# Patient Record
Sex: Male | Born: 1985 | Race: Black or African American | Hispanic: No | Marital: Single
Health system: Southern US, Community
[De-identification: ages and names within clinical notes are randomized; demographics above are authoritative.]

---

## 2017-09-07 ENCOUNTER — Emergency Department
Admission: EM | Admit: 2017-09-07 | Discharge: 2017-09-07 | Disposition: A | Discharge status: Home / Self Care | Payer: Medicaid Other | Attending: Emergency Medicine | Admitting: Emergency Medicine

## 2017-09-07 ENCOUNTER — Emergency Department: Payer: Medicaid Other

## 2017-09-07 ENCOUNTER — Encounter: Payer: Self-pay | Admitting: Emergency Medicine

## 2017-09-07 DIAGNOSIS — S61201A Unspecified open wound of left index finger without damage to nail, initial encounter: Secondary | ICD-10-CM | POA: Insufficient documentation

## 2017-09-07 DIAGNOSIS — Y9289 Other specified places as the place of occurrence of the external cause: Secondary | ICD-10-CM | POA: Insufficient documentation

## 2017-09-07 DIAGNOSIS — Y9389 Activity, other specified: Secondary | ICD-10-CM | POA: Diagnosis not present

## 2017-09-07 DIAGNOSIS — W260XXA Contact with knife, initial encounter: Secondary | ICD-10-CM | POA: Diagnosis not present

## 2017-09-07 DIAGNOSIS — Y999 Unspecified external cause status: Secondary | ICD-10-CM | POA: Insufficient documentation

## 2017-09-07 DIAGNOSIS — S61209A Unspecified open wound of unspecified finger without damage to nail, initial encounter: Secondary | ICD-10-CM

## 2017-09-07 DIAGNOSIS — S6992XA Unspecified injury of left wrist, hand and finger(s), initial encounter: Secondary | ICD-10-CM | POA: Diagnosis present

## 2017-09-07 MED ORDER — NEOMYCIN-POLYMYXIN-PRAMOXINE 1 % EX CREA: TOPICAL_CREAM | Freq: Two times a day (BID) | CUTANEOUS | 0 refills | Status: AC

## 2017-09-07 MED ORDER — LIDOCAINE HCL (PF) 1 % IJ SOLN
INTRAMUSCULAR | Status: AC
  Filled 2017-09-07: qty 5

## 2017-09-07 MED ORDER — TRAMADOL HCL 50 MG PO TABS: 50.0000 mg | ORAL_TABLET | Freq: Two times a day (BID) | ORAL | 0 refills | Status: AC | PRN

## 2017-09-07 NOTE — ED Triage Notes (Signed)
Pt reports was cutting bushes with a knife and cut his index finger on his left hand. Area continues to bleed. Dressing applied.

## 2017-09-07 NOTE — ED Notes (Signed)
First Nurse Note: Per EMS pt was cutting tree limbs with large knife about 2 hours ago, states he "nicked" his index finger on left hand, "chunk" of skin gone exposing bone per EMS. Finger wrapped and not visualized at this time, bleeding noted to cloth around finger.

## 2017-09-07 NOTE — ED Provider Notes (Signed)
[facility] Emergency Department Provider Note   ____________________________________________   First MD Initiated Contact with Patient 09/07/17 1427     (approximate)  I have reviewed the triage vital signs and the nursing notes.   HISTORY  Chief Complaint Laceration and Finger Injury    HPI James Stewart is a 32 y.o. male patient complain laceration involving avulsion of skin from the left index finger prior to arrival.  Patient state he was cutting brush with a knife when he slipped and cut his finger.  Bleeding is only controlled with direct pressure.  Patient denies loss of sensation or loss of function of the finger.  Patient is right-hand dominant.  Patient tetanus shot is up-to-date.  Patient rates his pain as a 9/10.  Patient described the pain is "achy".  Pressure dressing applied in triage.  History reviewed. No pertinent past medical history.  There are no active problems to display for this patient.   History reviewed. No pertinent surgical history.  Prior to Admission medications   Medication Sig Start Date End Date Taking? Authorizing Provider  neomycin-polymyxin-pramoxine (NEOSPORIN PLUS) 1 % cream Apply topically 2 (two) times daily. 09/07/17   Joni Reining, PA-C  traMADol (ULTRAM) 50 MG tablet Take 1 tablet (50 mg total) by mouth every 12 (twelve) hours as needed. 09/07/17   Joni Reining, PA-C    Allergies Haldol [haloperidol]  No family history on file.  Social History Social History   Tobacco Use  . Smoking status: Not on file  Substance Use Topics  . Alcohol use: Not on file  . Drug use: Not on file    Review of Systems Constitutional: No fever/chills Eyes: No visual changes. ENT: No sore throat. Cardiovascular: Denies chest pain. Respiratory: Denies shortness of breath. Gastrointestinal: No abdominal pain.  No nausea, no vomiting.  No diarrhea.  No constipation. Genitourinary: Negative for  dysuria. Musculoskeletal: Negative for back pain. Skin: Negative for rash.  Avulsion injury to the volar aspect of the second digit left hand. Neurological: Negative for headaches, focal weakness or numbness.   ____________________________________________   PHYSICAL EXAM:  VITAL SIGNS: ED Triage Vitals  Enc Vitals Group     BP 09/07/17 1346 (!) 144/101     Pulse Rate 09/07/17 1346 73     Resp 09/07/17 1346 20     Temp 09/07/17 1346 98.4 F (36.9 C)     Temp Source 09/07/17 1346 Oral     SpO2 09/07/17 1346 100 %     Weight 09/07/17 1347 185 lb (83.9 kg)     Height 09/07/17 1347  (1.727 m)     Head Circumference --      Peak Flow --      Pain Score 09/07/17 1346 9     Pain Loc --      Pain Edu? --      Excl. in GC? --    Constitutional: Alert and oriented.  Anxious.   Cardiovascular: Normal rate, regular rhythm. Grossly normal heart sounds.  Good peripheral circulation. Respiratory: Normal respiratory effort.  No retractions. Lungs CTAB. Neurologic:  Normal speech and language. No gross focal neurologic deficits are appreciated.  Skin:  Skin is warm, dry and intact. No rash noted. Psychiatric: Mood and affect are normal. Speech and behavior are normal.  ____________________________________________   LABS (all labs ordered are listed, but only abnormal results are displayed)  Labs Reviewed - No data to display ____________________________________________  EKG   ____________________________________________  RADIOLOGY  No acute findings x-ray of the second digit left hand. Official radiology report(s): Dg Hand Complete Left  Result Date: 09/07/2017 CLINICAL DATA:  32 year old male with a history of hand injury EXAM: LEFT HAND - COMPLETE 3+ VIEW COMPARISON:  None. FINDINGS: No acute displaced fracture. Surgical gauze at the distal aspect of the first finger, left hand, somewhat obscuring the soft tissue details and bony details. No subluxation/dislocation. No  radiopaque foreign body identified. No significant degenerative changes. IMPRESSION: No acute bony abnormality. Electronically Signed   By: Gilmer Mor D.O.   On: 09/07/2017 14:28    ____________________________________________   PROCEDURES  Procedure(s) performed: None  Procedures  Critical Care performed: No  ____________________________________________   INITIAL IMPRESSION / ASSESSMENT AND PLAN / ED COURSE  As part of my medical decision making, I reviewed the following data within the electronic MEDICAL RECORD NUMBER    Avulsion injury to the second digit left hand.  Area was cleaned and Surgicel applied with pressure dressing.  Patient given discharge care instruction.  Patient given prescription for tramadol and Neosporin.  Patient advised to follow-up with the ED department if no improvement in 2 to 3 days.      ____________________________________________   FINAL CLINICAL IMPRESSION(S) / ED DIAGNOSES  Final diagnoses:  Avulsion of skin of finger without complication, initial encounter     ED Discharge Orders        Ordered    traMADol (ULTRAM) 50 MG tablet  Every 12 hours PRN     09/07/17 1432    neomycin-polymyxin-pramoxine (NEOSPORIN PLUS) 1 % cream  2 times daily     09/07/17 1432       Note:  This document was prepared using Dragon voice recognition software and may include unintentional dictation errors.    Joni Reining, PA-C 09/07/17 1438    Minna Antis, MD 09/07/17 [phone]

## 2017-09-07 NOTE — Discharge Instructions (Addendum)
Follow discharge care instructions. 

## 2019-03-18 IMAGING — DX DG HAND COMPLETE 3+V*L*
3 series · 3 of 3 positions shown · non-contrast
Comparison: None.

CLINICAL DATA: 32-year-old male with a history of hand injury

EXAM:
LEFT HAND - COMPLETE 3+ VIEW

[hand ap]
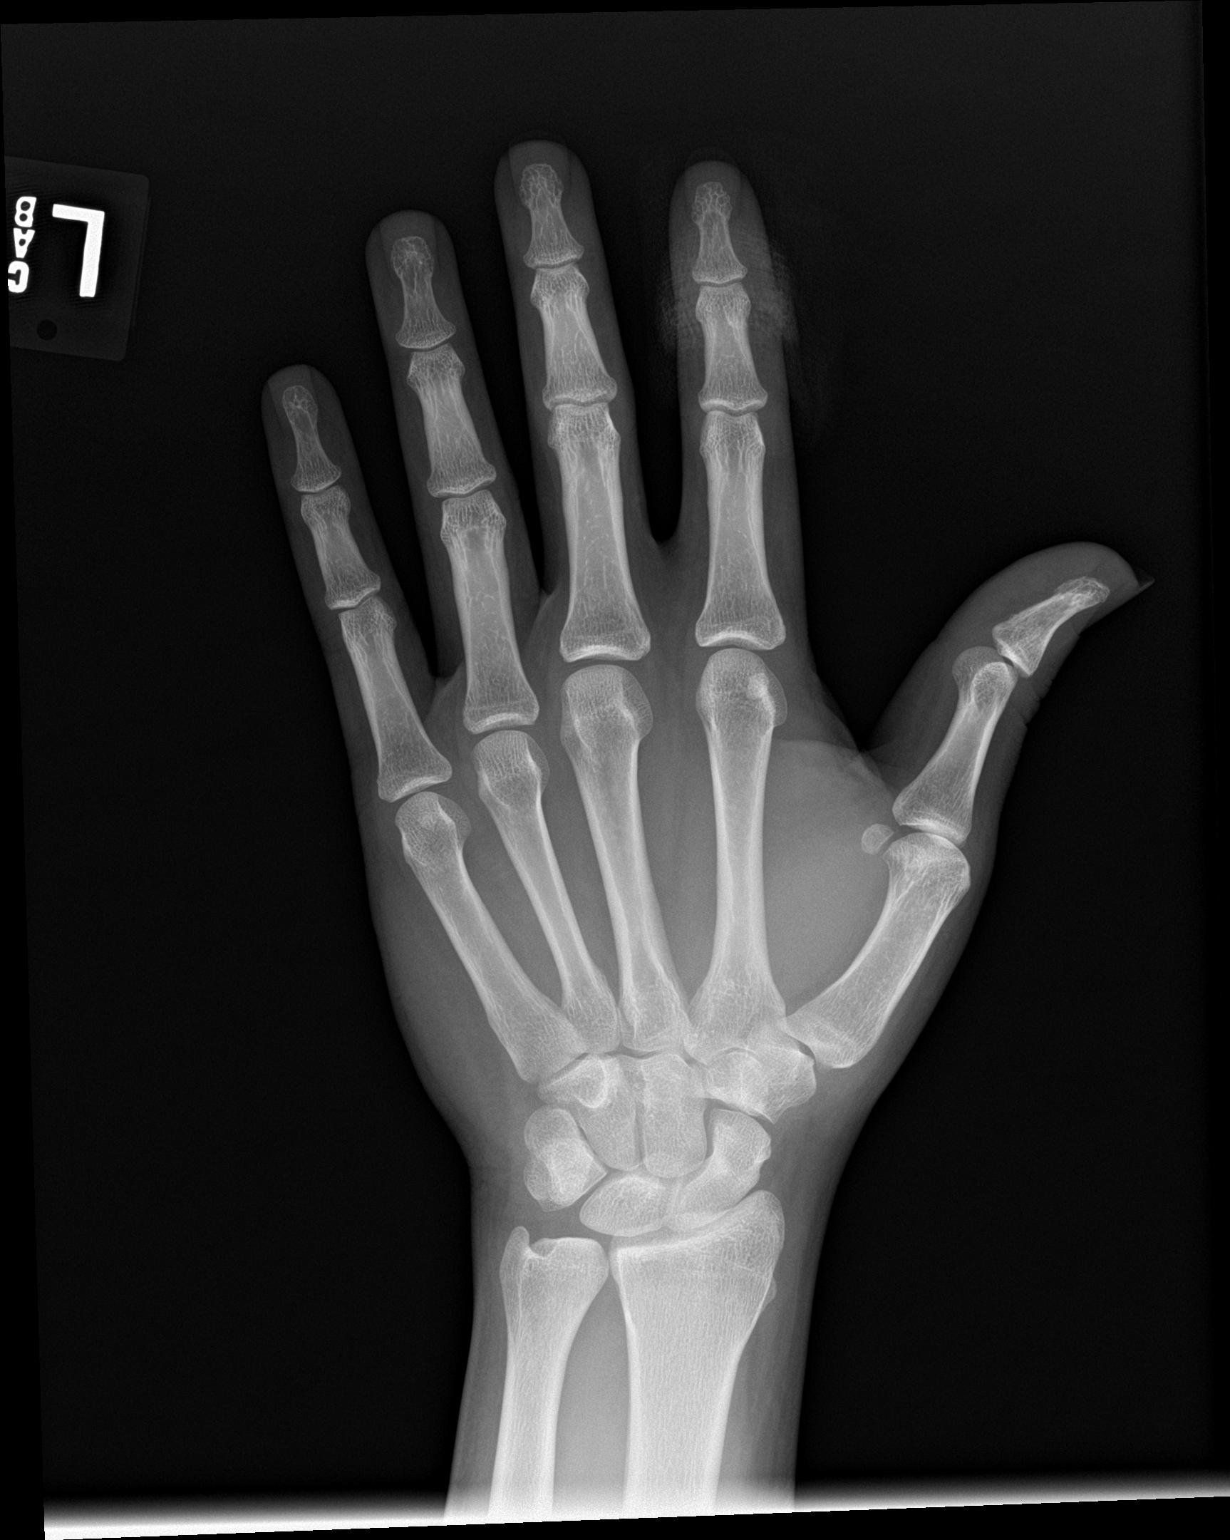

[hand obl]
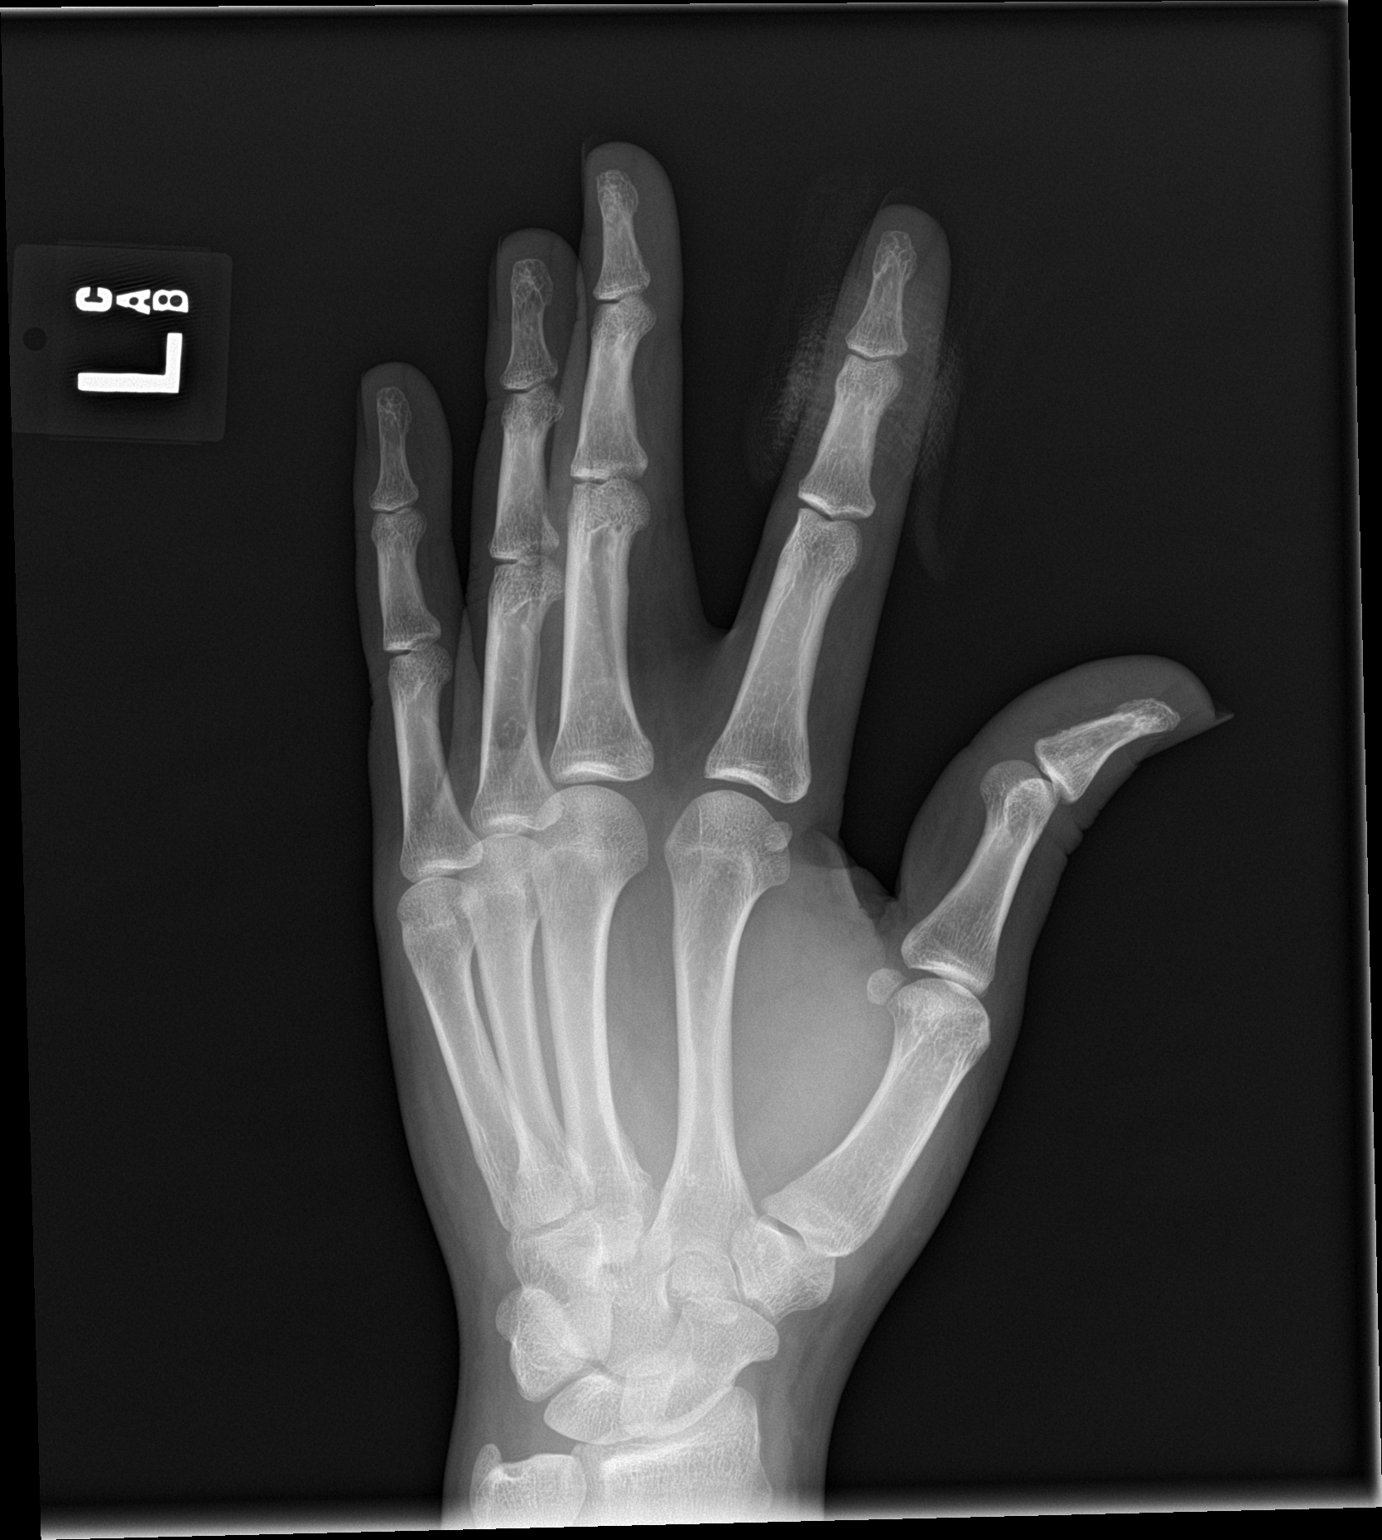

[hand lat]
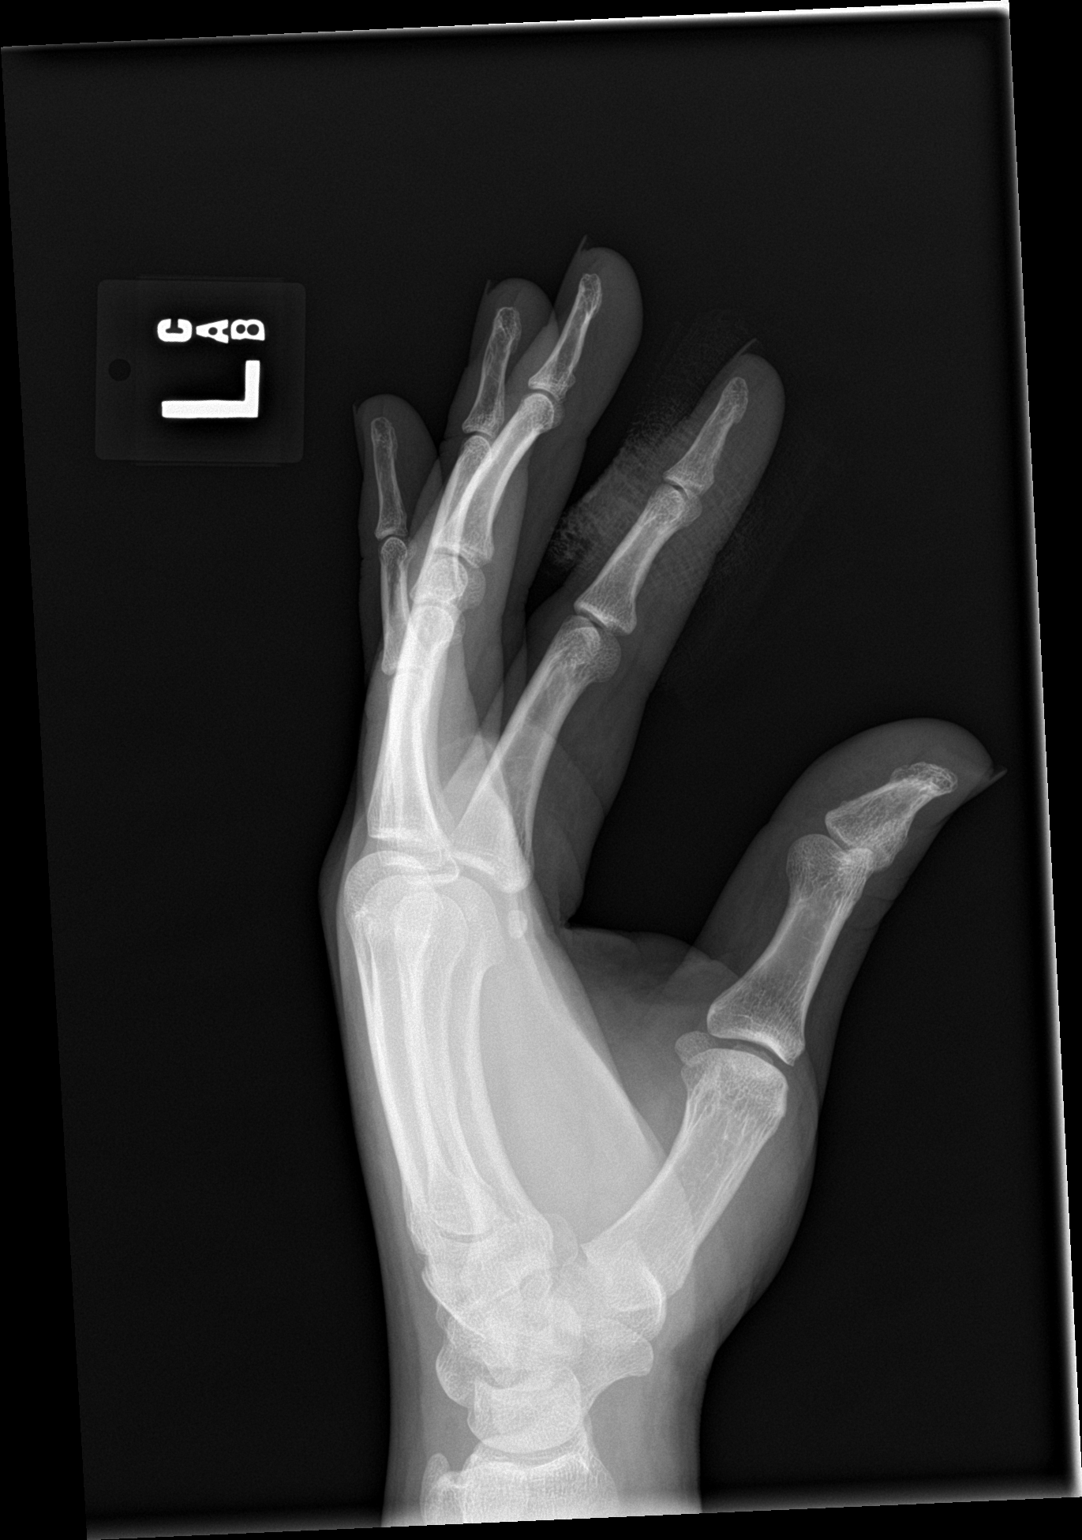

[3 of 3 positions shown; findings below may reference images not displayed]

FINDINGS: No acute displaced fracture. Surgical gauze at the distal aspect of
the first finger, left hand, somewhat obscuring the soft tissue
details and bony details. No subluxation/dislocation. No radiopaque
foreign body identified. No significant degenerative changes.
IMPRESSION: No acute bony abnormality.
